# Patient Record
Sex: Female | Born: 1995 | Hispanic: No | Marital: Single | Smoking: Never smoker
Health system: Southern US, Community
[De-identification: ages and names within clinical notes are randomized; demographics above are authoritative.]

---
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
## 2016-01-05 DIAGNOSIS — Z23 Encounter for immunization: Secondary | ICD-10-CM | POA: Diagnosis not present

## 2016-05-03 DIAGNOSIS — Z713 Dietary counseling and surveillance: Secondary | ICD-10-CM | POA: Diagnosis not present

## 2016-05-03 DIAGNOSIS — Z0000 Encounter for general adult medical examination without abnormal findings: Secondary | ICD-10-CM | POA: Diagnosis not present

## 2016-05-03 DIAGNOSIS — Z68.41 Body mass index (BMI) pediatric, 5th percentile to less than 85th percentile for age: Secondary | ICD-10-CM | POA: Diagnosis not present

## 2016-05-03 DIAGNOSIS — Z7189 Other specified counseling: Secondary | ICD-10-CM | POA: Diagnosis not present

## 2016-05-11 DIAGNOSIS — Z0000 Encounter for general adult medical examination without abnormal findings: Secondary | ICD-10-CM | POA: Diagnosis not present

## 2016-09-10 DIAGNOSIS — Z113 Encounter for screening for infections with a predominantly sexual mode of transmission: Secondary | ICD-10-CM | POA: Diagnosis not present

## 2016-09-10 DIAGNOSIS — Z114 Encounter for screening for human immunodeficiency virus [HIV]: Secondary | ICD-10-CM | POA: Diagnosis not present

## 2016-09-10 DIAGNOSIS — N760 Acute vaginitis: Secondary | ICD-10-CM | POA: Diagnosis not present

## 2017-01-31 DIAGNOSIS — N760 Acute vaginitis: Secondary | ICD-10-CM | POA: Diagnosis not present

## 2017-01-31 DIAGNOSIS — Z30011 Encounter for initial prescription of contraceptive pills: Secondary | ICD-10-CM | POA: Diagnosis not present

## 2017-01-31 DIAGNOSIS — Z113 Encounter for screening for infections with a predominantly sexual mode of transmission: Secondary | ICD-10-CM | POA: Diagnosis not present

## 2017-02-07 DIAGNOSIS — Z23 Encounter for immunization: Secondary | ICD-10-CM | POA: Diagnosis not present

## 2017-03-12 DIAGNOSIS — Z0000 Encounter for general adult medical examination without abnormal findings: Secondary | ICD-10-CM | POA: Diagnosis not present

## 2017-09-19 DIAGNOSIS — N390 Urinary tract infection, site not specified: Secondary | ICD-10-CM | POA: Diagnosis not present

## 2017-09-19 DIAGNOSIS — Z113 Encounter for screening for infections with a predominantly sexual mode of transmission: Secondary | ICD-10-CM | POA: Diagnosis not present

## 2017-11-05 DIAGNOSIS — L905 Scar conditions and fibrosis of skin: Secondary | ICD-10-CM | POA: Diagnosis not present

## 2017-12-26 DIAGNOSIS — Z113 Encounter for screening for infections with a predominantly sexual mode of transmission: Secondary | ICD-10-CM | POA: Diagnosis not present

## 2017-12-26 DIAGNOSIS — Z3041 Encounter for surveillance of contraceptive pills: Secondary | ICD-10-CM | POA: Diagnosis not present

## 2018-01-06 DIAGNOSIS — Z23 Encounter for immunization: Secondary | ICD-10-CM | POA: Diagnosis not present

## 2018-05-22 ENCOUNTER — Other Ambulatory Visit: Payer: Self-pay | Admitting: Family Medicine

## 2018-05-22 ENCOUNTER — Other Ambulatory Visit (HOSPITAL_COMMUNITY)
Admission: RE | Admit: 2018-05-22 | Discharge: 2018-05-22 | Disposition: A | Discharge status: Home / Self Care | Payer: 59 | Source: Ambulatory Visit | Attending: Family Medicine | Admitting: Family Medicine

## 2018-05-22 DIAGNOSIS — Z01411 Encounter for gynecological examination (general) (routine) with abnormal findings: Secondary | ICD-10-CM | POA: Diagnosis not present

## 2018-05-22 DIAGNOSIS — Z23 Encounter for immunization: Secondary | ICD-10-CM | POA: Diagnosis not present

## 2018-05-22 DIAGNOSIS — Z789 Other specified health status: Secondary | ICD-10-CM | POA: Diagnosis not present

## 2018-05-22 DIAGNOSIS — Z113 Encounter for screening for infections with a predominantly sexual mode of transmission: Secondary | ICD-10-CM | POA: Diagnosis not present

## 2018-05-22 DIAGNOSIS — Z0000 Encounter for general adult medical examination without abnormal findings: Secondary | ICD-10-CM | POA: Diagnosis not present

## 2018-05-25 LAB — CYTOLOGY - PAP
DIAGNOSIS: NEGATIVE
HPV: NOT DETECTED

## 2018-06-30 DIAGNOSIS — Z113 Encounter for screening for infections with a predominantly sexual mode of transmission: Secondary | ICD-10-CM | POA: Diagnosis not present

## 2018-12-28 DIAGNOSIS — Z23 Encounter for immunization: Secondary | ICD-10-CM | POA: Diagnosis not present

## 2019-06-11 DIAGNOSIS — N644 Mastodynia: Secondary | ICD-10-CM | POA: Diagnosis not present

## 2019-06-11 DIAGNOSIS — Z789 Other specified health status: Secondary | ICD-10-CM | POA: Diagnosis not present

## 2019-06-11 DIAGNOSIS — Z0000 Encounter for general adult medical examination without abnormal findings: Secondary | ICD-10-CM | POA: Diagnosis not present

## 2019-06-11 DIAGNOSIS — Z113 Encounter for screening for infections with a predominantly sexual mode of transmission: Secondary | ICD-10-CM | POA: Diagnosis not present

## 2019-12-30 DIAGNOSIS — Z113 Encounter for screening for infections with a predominantly sexual mode of transmission: Secondary | ICD-10-CM | POA: Diagnosis not present

## 2019-12-30 DIAGNOSIS — Z23 Encounter for immunization: Secondary | ICD-10-CM | POA: Diagnosis not present

## 2020-01-04 ENCOUNTER — Other Ambulatory Visit: Payer: 59

## 2020-01-05 ENCOUNTER — Other Ambulatory Visit: Payer: 59

## 2020-02-23 DIAGNOSIS — B081 Molluscum contagiosum: Secondary | ICD-10-CM | POA: Diagnosis not present

## 2020-02-28 ENCOUNTER — Other Ambulatory Visit: Payer: 59

## 2020-03-25 DIAGNOSIS — Z20822 Contact with and (suspected) exposure to covid-19: Secondary | ICD-10-CM | POA: Diagnosis not present

## 2020-06-16 DIAGNOSIS — Z131 Encounter for screening for diabetes mellitus: Secondary | ICD-10-CM | POA: Diagnosis not present

## 2020-06-16 DIAGNOSIS — Z113 Encounter for screening for infections with a predominantly sexual mode of transmission: Secondary | ICD-10-CM | POA: Diagnosis not present

## 2020-06-16 DIAGNOSIS — Z13220 Encounter for screening for lipoid disorders: Secondary | ICD-10-CM | POA: Diagnosis not present

## 2020-06-16 DIAGNOSIS — Z0000 Encounter for general adult medical examination without abnormal findings: Secondary | ICD-10-CM | POA: Diagnosis not present

## 2020-12-25 DIAGNOSIS — Z20822 Contact with and (suspected) exposure to covid-19: Secondary | ICD-10-CM | POA: Diagnosis not present

## 2021-05-03 DIAGNOSIS — H5203 Hypermetropia, bilateral: Secondary | ICD-10-CM | POA: Diagnosis not present

## 2021-05-22 DIAGNOSIS — R509 Fever, unspecified: Secondary | ICD-10-CM | POA: Diagnosis not present

## 2021-05-22 DIAGNOSIS — R197 Diarrhea, unspecified: Secondary | ICD-10-CM | POA: Diagnosis not present

## 2021-05-22 DIAGNOSIS — R109 Unspecified abdominal pain: Secondary | ICD-10-CM | POA: Diagnosis not present

## 2021-05-22 DIAGNOSIS — Z03818 Encounter for observation for suspected exposure to other biological agents ruled out: Secondary | ICD-10-CM | POA: Diagnosis not present

## 2021-05-22 DIAGNOSIS — R1010 Upper abdominal pain, unspecified: Secondary | ICD-10-CM | POA: Diagnosis not present

## 2021-07-12 DIAGNOSIS — R253 Fasciculation: Secondary | ICD-10-CM | POA: Diagnosis not present

## 2021-07-12 DIAGNOSIS — Z0000 Encounter for general adult medical examination without abnormal findings: Secondary | ICD-10-CM | POA: Diagnosis not present

## 2021-07-12 DIAGNOSIS — Z13220 Encounter for screening for lipoid disorders: Secondary | ICD-10-CM | POA: Diagnosis not present

## 2021-07-12 DIAGNOSIS — Z113 Encounter for screening for infections with a predominantly sexual mode of transmission: Secondary | ICD-10-CM | POA: Diagnosis not present

## 2021-08-02 ENCOUNTER — Other Ambulatory Visit (HOSPITAL_COMMUNITY)
Admission: RE | Admit: 2021-08-02 | Discharge: 2021-08-02 | Disposition: A | Discharge status: Home / Self Care | Payer: 59 | Source: Ambulatory Visit | Attending: Nurse Practitioner | Admitting: Nurse Practitioner

## 2021-08-02 DIAGNOSIS — Z01419 Encounter for gynecological examination (general) (routine) without abnormal findings: Secondary | ICD-10-CM | POA: Diagnosis not present

## 2021-08-02 DIAGNOSIS — Z124 Encounter for screening for malignant neoplasm of cervix: Secondary | ICD-10-CM | POA: Insufficient documentation

## 2021-08-02 DIAGNOSIS — R350 Frequency of micturition: Secondary | ICD-10-CM | POA: Diagnosis not present

## 2021-08-02 DIAGNOSIS — N898 Other specified noninflammatory disorders of vagina: Secondary | ICD-10-CM | POA: Diagnosis not present

## 2021-08-17 LAB — CYTOLOGY - PAP: Diagnosis: NEGATIVE

## 2022-02-26 DIAGNOSIS — Z708 Other sex counseling: Secondary | ICD-10-CM | POA: Diagnosis not present

## 2022-02-26 DIAGNOSIS — Z6820 Body mass index (BMI) 20.0-20.9, adult: Secondary | ICD-10-CM | POA: Diagnosis not present

## 2022-02-26 DIAGNOSIS — Z113 Encounter for screening for infections with a predominantly sexual mode of transmission: Secondary | ICD-10-CM | POA: Diagnosis not present

## 2022-02-26 DIAGNOSIS — Z23 Encounter for immunization: Secondary | ICD-10-CM | POA: Diagnosis not present

## 2022-02-26 DIAGNOSIS — Z114 Encounter for screening for human immunodeficiency virus [HIV]: Secondary | ICD-10-CM | POA: Diagnosis not present

## 2022-02-26 DIAGNOSIS — Z118 Encounter for screening for other infectious and parasitic diseases: Secondary | ICD-10-CM | POA: Diagnosis not present

## 2022-02-27 DIAGNOSIS — Z114 Encounter for screening for human immunodeficiency virus [HIV]: Secondary | ICD-10-CM | POA: Diagnosis not present

## 2022-02-27 DIAGNOSIS — Z113 Encounter for screening for infections with a predominantly sexual mode of transmission: Secondary | ICD-10-CM | POA: Diagnosis not present

## 2022-02-27 DIAGNOSIS — Z1159 Encounter for screening for other viral diseases: Secondary | ICD-10-CM | POA: Diagnosis not present

## 2022-05-14 ENCOUNTER — Encounter (HOSPITAL_BASED_OUTPATIENT_CLINIC_OR_DEPARTMENT_OTHER): Payer: Self-pay | Admitting: Emergency Medicine

## 2022-05-14 ENCOUNTER — Emergency Department (HOSPITAL_BASED_OUTPATIENT_CLINIC_OR_DEPARTMENT_OTHER)
Admission: EM | Admit: 2022-05-14 | Discharge: 2022-05-14 | Disposition: A | Discharge status: Home / Self Care | Payer: Commercial Managed Care - PPO | Attending: Emergency Medicine | Admitting: Emergency Medicine

## 2022-05-14 ENCOUNTER — Emergency Department (HOSPITAL_BASED_OUTPATIENT_CLINIC_OR_DEPARTMENT_OTHER): Payer: Commercial Managed Care - PPO

## 2022-05-14 ENCOUNTER — Other Ambulatory Visit: Payer: Self-pay

## 2022-05-14 DIAGNOSIS — O219 Vomiting of pregnancy, unspecified: Secondary | ICD-10-CM | POA: Diagnosis not present

## 2022-05-14 DIAGNOSIS — O210 Mild hyperemesis gravidarum: Secondary | ICD-10-CM | POA: Diagnosis not present

## 2022-05-14 DIAGNOSIS — Z3A01 Less than 8 weeks gestation of pregnancy: Secondary | ICD-10-CM

## 2022-05-14 DIAGNOSIS — O26891 Other specified pregnancy related conditions, first trimester: Secondary | ICD-10-CM | POA: Diagnosis not present

## 2022-05-14 LAB — BASIC METABOLIC PANEL
Anion gap: 7 (ref 5–15)
BUN: 8 mg/dL (ref 6–20)
CO2: 24 mmol/L (ref 22–32)
Calcium: 8.5 mg/dL — ABNORMAL LOW (ref 8.9–10.3)
Chloride: 100 mmol/L (ref 98–111)
Creatinine, Ser: 0.71 mg/dL (ref 0.44–1.00)
GFR, Estimated: >60 mL/min (ref >60)
Glucose, Bld: 87 mg/dL (ref 70–99)
Potassium: 3.6 mmol/L (ref 3.5–5.1)
Sodium: 131 mmol/L — ABNORMAL LOW (ref 135–145)

## 2022-05-14 LAB — URINALYSIS, MICROSCOPIC (REFLEX)

## 2022-05-14 LAB — URINALYSIS, ROUTINE W REFLEX MICROSCOPIC
Bilirubin Urine: NEGATIVE
Glucose, UA: NEGATIVE mg/dL
Hgb urine dipstick: NEGATIVE
Ketones, ur: >=80 mg/dL — AB
Nitrite: NEGATIVE
Protein, ur: NEGATIVE mg/dL
Specific Gravity, Urine: 1.025 (ref 1.005–1.030)
pH: 6 (ref 5.0–8.0)

## 2022-05-14 LAB — HCG, QUANTITATIVE, PREGNANCY: hCG, Beta Chain, Quant, S: 75315 m[IU]/mL — ABNORMAL HIGH (ref <5)

## 2022-05-14 LAB — PREGNANCY, URINE: Preg Test, Ur: POSITIVE — AB

## 2022-05-14 MED ORDER — ONDANSETRON HCL 4 MG/2ML IJ SOLN
4.0000 mg | Freq: Once | INTRAMUSCULAR | Status: AC
  Administered 2022-05-14: 4 mg via INTRAVENOUS
  Filled 2022-05-14: qty 2

## 2022-05-14 MED ORDER — SODIUM CHLORIDE 0.9 % IV BOLUS
500.0000 mL | Freq: Once | INTRAVENOUS | Status: AC
  Administered 2022-05-14: 500 mL via INTRAVENOUS

## 2022-05-14 MED ORDER — ONDANSETRON 8 MG PO TBDP: ORAL_TABLET | ORAL | 0 refills | Status: AC

## 2022-05-14 MED ORDER — ONDANSETRON 8 MG PO TBDP: ORAL_TABLET | ORAL | 0 refills | Status: DC

## 2022-05-14 NOTE — ED Notes (Signed)
Patient has been resting.  She reports her pain has decreased.  MD at bedside discussing DC instructions.

## 2022-05-14 NOTE — ED Provider Notes (Signed)
[facility] Provider Note   CSN: [id number] Arrival date & time: 05/14/22  0111     History  Chief Complaint  Patient presents with   Emesis   Abdominal Pain    Courtney Barrett is a 27 y.o. female.  The history is provided by the patient.  Emesis Severity:  Severe Timing:  Intermittent Quality:  Stomach contents Progression:  Unchanged Chronicity:  New Recent urination:  Normal Context: not post-tussive   Relieved by:  Nothing Worsened by:  Nothing Ineffective treatments:  None tried Associated symptoms comment:  Cramping Abdominal Pain Associated symptoms: vomiting        Home Medications Prior to Admission medications   Medication Sig Start Date End Date Taking? Authorizing Provider  ondansetron (ZOFRAN-ODT) 8 MG disintegrating tablet 8mg  ODT q8 hours prn nausea 05/14/22  Yes Brewer Hitchman, MD      Allergies    Patient has no known allergies.    Review of Systems   Review of Systems  Gastrointestinal:  Positive for vomiting.  All other systems reviewed and are negative.   Physical Exam Updated Vital Signs BP 117/74 (BP Location: Left Arm)   Pulse 65   Temp 99 F (37.2 C) (Oral)   Resp 20   Ht 5\' 9"  (1.753 m)   Wt 61.2 kg   LMP  (Exact Date)   SpO2 99%   BMI 19.94 kg/m  Physical Exam Vitals and nursing note reviewed.  Constitutional:      General: She is not in acute distress.    Appearance: Normal appearance. She is well-developed.  HENT:     Head: Normocephalic and atraumatic.     Nose: Nose normal.  Eyes:     Pupils: Pupils are equal, round, and reactive to light.  Cardiovascular:     Rate and Rhythm: Normal rate and regular rhythm.     Pulses: Normal pulses.     Heart sounds: Normal heart sounds.  Pulmonary:     Effort: Pulmonary effort is normal. No respiratory distress.     Breath sounds: Normal breath sounds.  Abdominal:     General: Bowel sounds are normal. There is no distension.      Palpations: Abdomen is soft. There is no mass.     Tenderness: There is no abdominal tenderness. There is no guarding or rebound.     Hernia: No hernia is present.  Genitourinary:    Vagina: No vaginal discharge.  Musculoskeletal:        General: Normal range of motion.     Cervical back: Neck supple.  Skin:    General: Skin is warm and dry.     Capillary Refill: Capillary refill takes less than 2 seconds.     Findings: No erythema or rash.  Neurological:     General: No focal deficit present.     Mental Status: She is alert and oriented to person, place, and time.     Deep Tendon Reflexes: Reflexes normal.  Psychiatric:        Mood and Affect: Mood normal.     ED Results / Procedures / Treatments   Labs (all labs ordered are listed, but only abnormal results are displayed) Results for orders placed or performed during the hospital encounter of 05/14/22  Urinalysis, Routine w reflex microscopic -Urine, Clean Catch  Result Value Ref Range   Color, Urine YELLOW YELLOW   APPearance HAZY (A) CLEAR   Specific Gravity, Urine 1.025 1.005 - 1.030  pH 6.0 5.0 - 8.0   Glucose, UA NEGATIVE NEGATIVE mg/dL   Hgb urine dipstick NEGATIVE NEGATIVE   Bilirubin Urine NEGATIVE NEGATIVE   Ketones, ur >=80 (A) NEGATIVE mg/dL   Protein, ur NEGATIVE NEGATIVE mg/dL   Nitrite NEGATIVE NEGATIVE   Leukocytes,Ua TRACE (A) NEGATIVE  hCG, quantitative, pregnancy  Result Value Ref Range   hCG, Beta Chain, Quant, S 75,315 (H) <5 mIU/mL  Pregnancy, urine  Result Value Ref Range   Preg Test, Ur POSITIVE (A) NEGATIVE  Urinalysis, Microscopic (reflex)  Result Value Ref Range   RBC / HPF 0-5 0 - 5 RBC/hpf   WBC, UA 11-20 0 - 5 WBC/hpf   Bacteria, UA FEW (A) NONE SEEN   Squamous Epithelial / HPF 0-5 0 - 5 /HPF   Mucus PRESENT   Basic metabolic panel  Result Value Ref Range   Sodium 131 (L) 135 - 145 mmol/L   Potassium 3.6 3.5 - 5.1 mmol/L   Chloride 100 98 - 111 mmol/L   CO2 24 22 - 32 mmol/L    Glucose, Bld 87 70 - 99 mg/dL   BUN 8 6 - 20 mg/dL   Creatinine, Ser 0.71 0.44 - 1.00 mg/dL   Calcium 8.5 (L) 8.9 - 10.3 mg/dL   GFR, Estimated >60 >60 mL/min   Anion gap 7 5 - 15   US OB LESS THAN 14 WEEKS WITH OB TRANSVAGINAL  Result Date: 05/14/2022 CLINICAL DATA:  Emesis, midline pelvic pain. EXAM: OBSTETRIC <14 WK [place] AND TRANSVAGINAL OB US TECHNIQUE: Both transabdominal and transvaginal ultrasound examinations were performed for complete evaluation of the gestation as well as the maternal uterus, adnexal regions, and pelvic cul-de-sac. Transvaginal technique was performed to assess early pregnancy. COMPARISON:  None Available. FINDINGS: Intrauterine gestational sac: Single Yolk sac:  Yes Embryo:  Yes Cardiac Activity: Yes Heart Rate: 101 bpm CRL:  2.8 mm   5 w   6 d                  [place] EDC: 01/08/2023 Subchorionic hemorrhage:  None visualized. Maternal uterus/adnexae: A complex hypodensity is present in the right ovary measuring 1.0 x 1.1 x 0.9 cm, possible corpus luteal or hemorrhagic cyst. The left ovary is within normal limits. No free fluid in the pelvis. IMPRESSION: Single live intrauterine pregnancy with estimated gestational age of [redacted] weeks 6 days. Electronically Signed   By: Brett Fairy M.D.   On: 05/14/2022 02:26     Radiology US OB LESS THAN 14 WEEKS WITH OB TRANSVAGINAL  Result Date: 05/14/2022 CLINICAL DATA:  Emesis, midline pelvic pain. EXAM: OBSTETRIC <14 WK [place] AND TRANSVAGINAL OB US TECHNIQUE: Both transabdominal and transvaginal ultrasound examinations were performed for complete evaluation of the gestation as well as the maternal uterus, adnexal regions, and pelvic cul-de-sac. Transvaginal technique was performed to assess early pregnancy. COMPARISON:  None Available. FINDINGS: Intrauterine gestational sac: Single Yolk sac:  Yes Embryo:  Yes Cardiac Activity: Yes Heart Rate: 101 bpm CRL:  2.8 mm   5 w   6 d                  [place] EDC: 01/08/2023 Subchorionic hemorrhage:  None  visualized. Maternal uterus/adnexae: A complex hypodensity is present in the right ovary measuring 1.0 x 1.1 x 0.9 cm, possible corpus luteal or hemorrhagic cyst. The left ovary is within normal limits. No free fluid in the pelvis. IMPRESSION: Single live intrauterine pregnancy with estimated gestational age of [redacted] weeks  6 days. Electronically Signed   By: Brett Fairy M.D.   On: 05/14/2022 02:26    Procedures Procedures    Medications Ordered in ED Medications  sodium chloride 0.9 % bolus 500 mL (0 mLs Intravenous Stopped 05/14/22 0329)  ondansetron (ZOFRAN) injection 4 mg (4 mg Intravenous Given 05/14/22 0246)    ED Course/ Medical Decision Making/ A&P                             Medical Decision Making Patient who is pregnant LMP 12/16 who presents with nausea and vomiting and abdominal cramping   Amount and/or Complexity of Data Reviewed External Data Reviewed: notes.    Details: Previous notes in EPIC reviewed  Labs: ordered.    Details: Pregnancy test is positive.  Beta HCG is 75,315.  Urine has ketones but is negative for infection.  Sodium slight low 131, potassium normal 3.6, normal BUN and normal creatinine .71.   Radiology: ordered and independent interpretation performed.    Details: IUP on [place]  Risk Prescription drug management. Risk Details: Patient has a single intrauterine pregnancy with age 58 weeks 6 days.  Exam and vitals are benign and reassuring.  No signs of a surgical abdomen.  IV fluids and IV zofran given.  No further emesis.  Stable for discharge.  Follow up with OB for ongoing care.  Strict return precautions.      Final Clinical Impression(s) / ED Diagnoses Final diagnoses:  Morning sickness  Less than [redacted] weeks gestation of pregnancy   Return for intractable cough, coughing up blood, fevers > 100.4 unrelieved by medication, shortness of breath, intractable vomiting, chest pain, shortness of breath, weakness, numbness, changes in speech, facial asymmetry,  abdominal pain, passing out, Inability to tolerate liquids or food, cough, altered mental status or any concerns. No signs of systemic illness or infection. The patient is nontoxic-appearing on exam and vital signs are within normal limits.  I have reviewed the triage vital signs and the nursing notes. Pertinent labs & imaging results that were available during my care of the patient were reviewed by me and considered in my medical decision making (see chart for details). After history, exam, and medical workup I feel the patient has been appropriately medically screened and is safe for discharge home. Pertinent diagnoses were discussed with the patient. Patient was given return precautions. Rx / DC Orders ED Discharge Orders          Ordered    ondansetron (ZOFRAN-ODT) 8 MG disintegrating tablet        05/14/22 0236              Kanisha Duba, MD 05/14/22 9833

## 2022-05-14 NOTE — ED Triage Notes (Addendum)
Pt is [redacted] weeks pregnant and has had N/V since last week and now cannot eat or drink. Pt tearful in triage. Pt plans to have abortion. Pt endorses abd pain at this time with intermittent cramping. Thinks she may have BV as well. No urinary symptoms.

## 2022-05-14 NOTE — ED Notes (Signed)
Patient verbalized understanding of discharge instructions and reasons to return to the ED 

## 2022-07-24 DIAGNOSIS — Z13220 Encounter for screening for lipoid disorders: Secondary | ICD-10-CM | POA: Diagnosis not present

## 2022-07-24 DIAGNOSIS — Z0000 Encounter for general adult medical examination without abnormal findings: Secondary | ICD-10-CM | POA: Diagnosis not present

## 2022-08-27 DIAGNOSIS — Z23 Encounter for immunization: Secondary | ICD-10-CM | POA: Diagnosis not present

## 2022-08-27 DIAGNOSIS — Z01419 Encounter for gynecological examination (general) (routine) without abnormal findings: Secondary | ICD-10-CM | POA: Diagnosis not present

## 2022-08-27 DIAGNOSIS — Z113 Encounter for screening for infections with a predominantly sexual mode of transmission: Secondary | ICD-10-CM | POA: Diagnosis not present

## 2022-08-27 DIAGNOSIS — N898 Other specified noninflammatory disorders of vagina: Secondary | ICD-10-CM | POA: Diagnosis not present

## 2022-11-01 DIAGNOSIS — Z23 Encounter for immunization: Secondary | ICD-10-CM | POA: Diagnosis not present

## 2023-03-12 DIAGNOSIS — Z23 Encounter for immunization: Secondary | ICD-10-CM | POA: Diagnosis not present
